# Patient Record
Sex: Female | Born: 2005 | Race: White | Hispanic: No | Marital: Single | Smoking: Current every day smoker
Health system: Southern US, Community
[De-identification: ages and names within clinical notes are randomized; demographics above are authoritative.]

---
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
## 2023-01-24 IMAGING — DX DG CHEST 2V
2 series · 2 of 2 positions shown · non-contrast
Comparison: None.

CLINICAL DATA: Concern for pill stuck in throat.

EXAM:
CHEST - 2 VIEW; NECK SOFT TISSUES - 1+ VIEW

[chest pa]
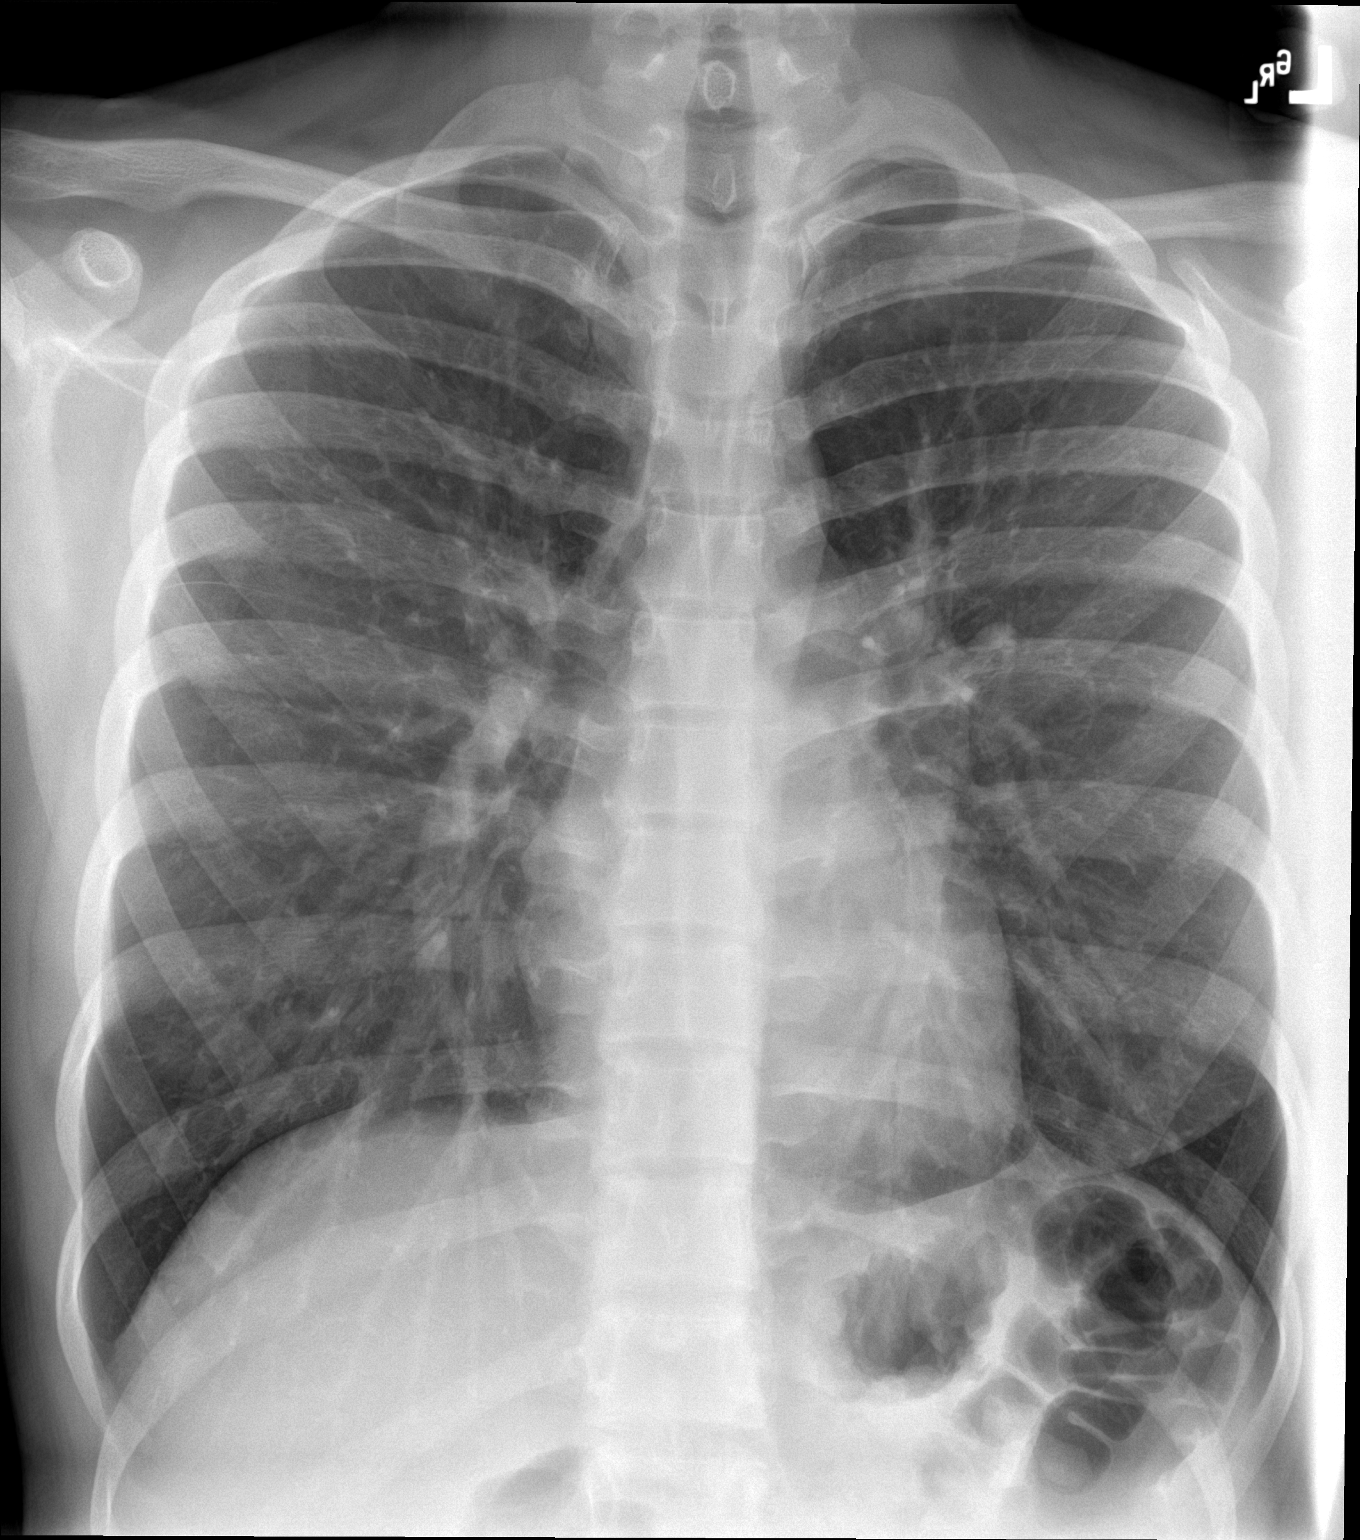

[chest lat]
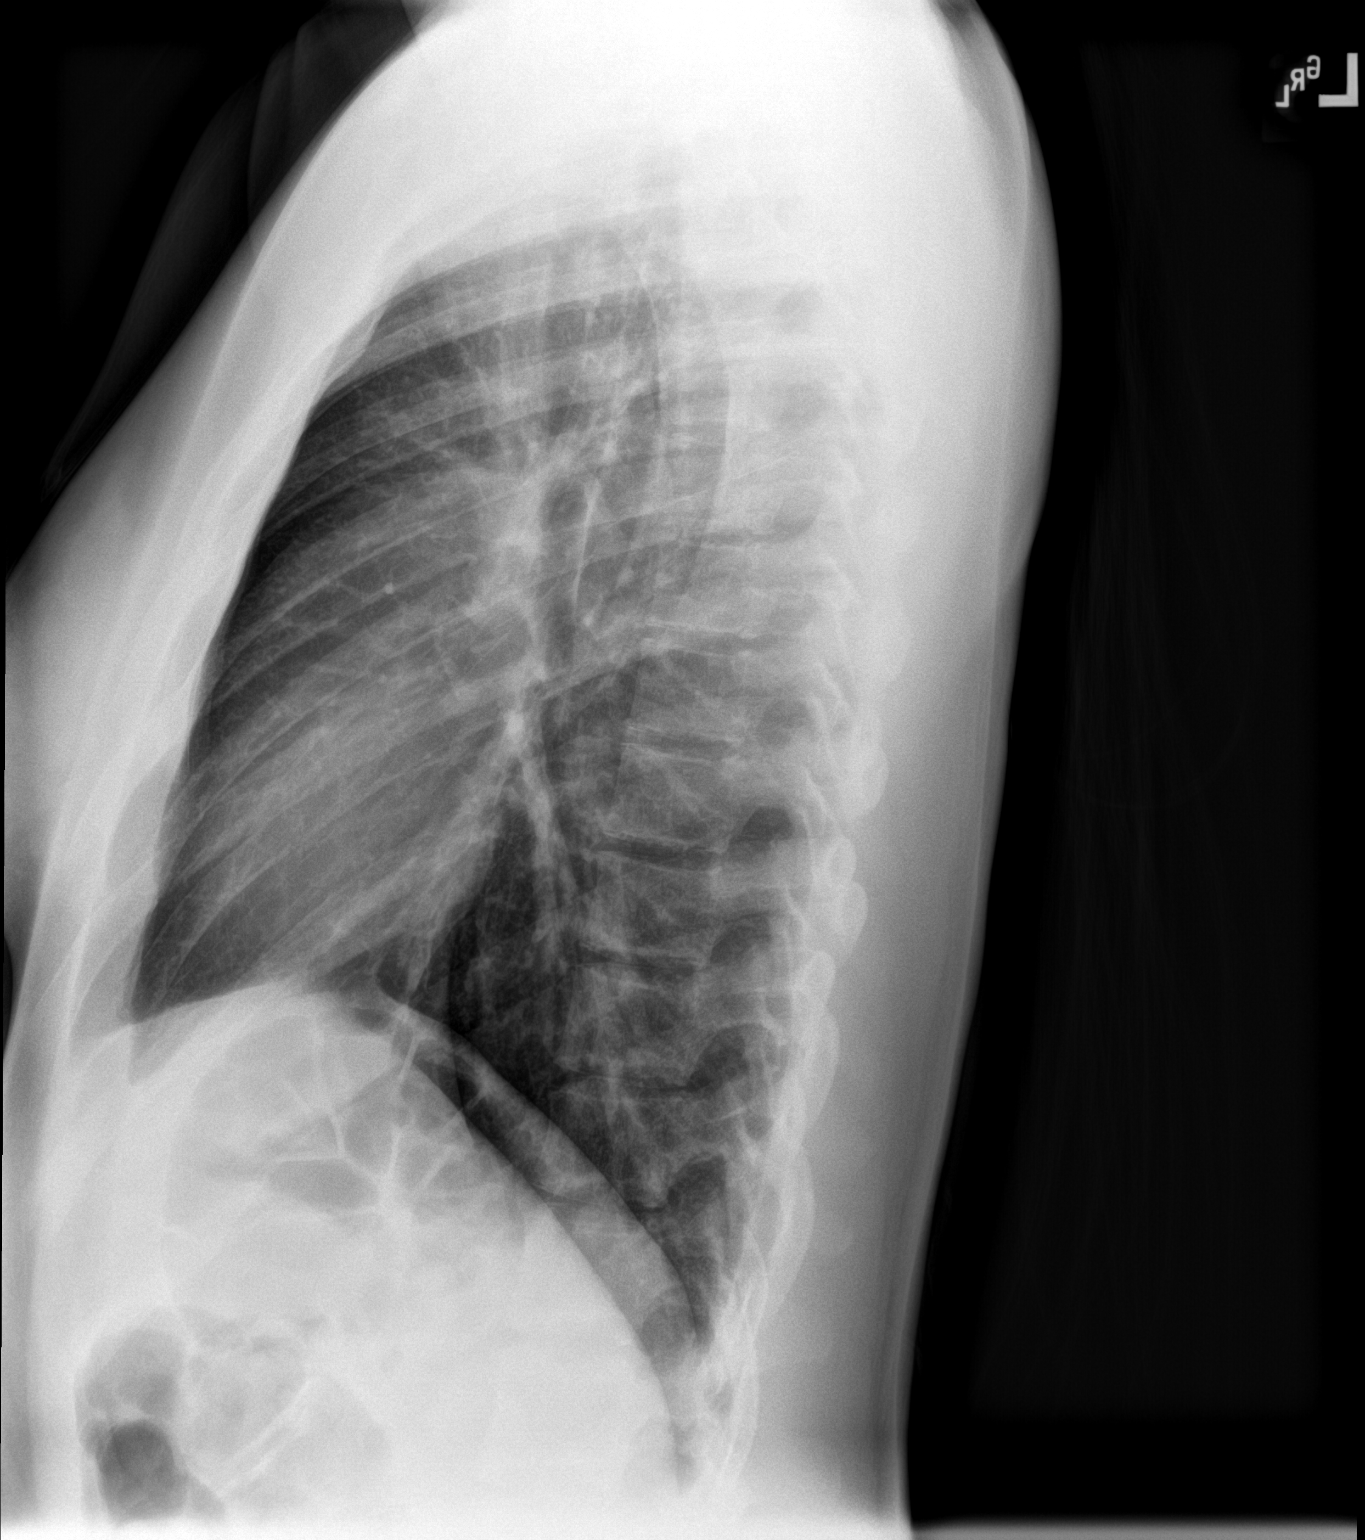

[2 of 2 positions shown; findings below may reference images not displayed]

FINDINGS: There is no evidence of retropharyngeal soft tissue swelling or
epiglottic enlargement. The cervical airway is unremarkable. No
radio-opaque foreign body identified.

No visible radiopaque foreign body projecting over the chest. No
consolidation, features of edema, pneumothorax, or effusion. No
abnormal expansion or asymmetric hyperinflation to suggest airway
obstruction. Pulmonary vascularity is normally distributed. The
cardiomediastinal contours are unremarkable. No acute osseous or
soft tissue abnormality.
IMPRESSION: No visible radiopaque foreign body or discernible pill fragments are
seen within the cervical airway or overlying the airways of the
chest.

No acute cardiopulmonary abnormality. Specifically, no evidence of
airway obstruction.

## 2023-02-27 DIAGNOSIS — Z3042 Encounter for surveillance of injectable contraceptive: Secondary | ICD-10-CM | POA: Diagnosis not present

## 2023-03-10 ENCOUNTER — Ambulatory Visit: Payer: BC Managed Care – PPO | Admitting: Psychiatry

## 2023-04-28 ENCOUNTER — Ambulatory Visit (INDEPENDENT_AMBULATORY_CARE_PROVIDER_SITE_OTHER): Payer: BC Managed Care – PPO | Admitting: Psychiatry

## 2023-04-28 ENCOUNTER — Encounter: Payer: Self-pay | Admitting: Psychiatry

## 2023-04-28 DIAGNOSIS — F4010 Social phobia, unspecified: Secondary | ICD-10-CM | POA: Diagnosis not present

## 2023-04-28 DIAGNOSIS — F3342 Major depressive disorder, recurrent, in full remission: Secondary | ICD-10-CM

## 2023-04-28 DIAGNOSIS — F439 Reaction to severe stress, unspecified: Secondary | ICD-10-CM | POA: Diagnosis not present

## 2023-04-28 MED ORDER — SERTRALINE HCL 100 MG PO TABS: ORAL_TABLET | ORAL | 2 refills | Status: DC

## 2023-04-28 NOTE — Progress Notes (Signed)
[facility] [address], [place]   Follow-up visit  Date of Service: 04/28/2023  CC/Purpose: Routine medication management follow up.    Carol Berg is a 17 y.o. female with a past psychiatric history of depression, anxiety, trauma, substance use who presents today for a psychiatric follow up appointment. Patient is in the custody of her parent.    The patient was last seen on 12/10/22, at which time the following plan was established: Medication management:             - Zoloft 150mg  daily for anxiety  _______________________________________________________________________________________ Acute events/encounters since last visit: none    Carol Berg reports that things have been going pretty well since her last visit. She is in college classes now and still working. She feels this is manageable. She feels that her mood and anxiety are both doing well and has no concerns or questions at this time. No complaints at this time. No SI/Hi/AVH.  Sleep: stable Appetite: Stable Depression: denies Bipolar symptoms:  denies Current suicidal/homicidal ideations:  denied Current auditory/visual hallucinations:  denied     Suicide Attempt/Self-Harm History: denies  Psychotherapy: not currently  Previous psychiatric medication trials:  Abilify, Lexapro, Prozac, Seroquel, Lamictal, clonidine. Unclear benefit in all medicine      School Name: [employer] freshman. 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   There were no vitals taken for this visit.There is no height or weight on file to calculate BMI.  General Appearance: Neat and Well Groomed  Eye Contact:  Good  Speech:  Clear and Coherent and Normal Rate  Mood:   happy  Affect:  Appropriate  Thought Process:  Coherent and Goal Directed  Orientation:  Full (Time, Place, and Person)  Thought Content:  Logical  Suicidal Thoughts:  No  Homicidal Thoughts:  No  Memory:  Immediate;   Good  Judgement:  Fair  Insight:  Fair  Psychomotor Activity:  Normal  Concentration:  Concentration: Good  Recall:  Good  Fund of Knowledge:  Good  Language:  Good  Assets:  Communication Skills Desire for Improvement Financial Resources/Insurance Housing Leisure Time Physical Health Resilience Social Support Talents/Skills Transportation Vocational/Educational  Cognition:  WNL      Assessment   Psychiatric Diagnoses:   ICD-10-CM   1. Social anxiety disorder  F40.10     2. Trauma and stressor-related disorder  F43.9     3. MDD (major depressive disorder), recurrent, in full remission (HCC)  F33.42       Complexity: Moderate  Patient Education and Counseling:  Supportive therapy provided for identified psychosocial stressors.  Medication education provided and decisions regarding medication regimen discussed with patient/guardian.   On assessment today, Carol Berg has remained stable with her medicine. No current issues reported. We will not adjust her medicines at this time given her stability. No SI/HI/AVH.   Plan  Medication management:  - Zoloft 150mg  daily for anxiety  Labs/Studies:  - none  Additional recommendations:  - Recommend starting therapy, Crisis plan reviewed and patient verbally contracts for safety. Go to ED with emergent symptoms or safety concerns, and Risks, benefits, side effects of medications, including any / all black box  warnings, discussed with patient, who verbalizes their understanding    - Recommend cutting down on THC and nicotine use   Follow Up: Return in 3 months - Call in the interim for any side-effects, decompensation, questions, or problems between now and the next visit.   I have spent 25 minutes reviewing the patients chart, meeting with the patient and family, and reviewing medicines and side effects.   [name], MD [facility]

## 2023-07-29 ENCOUNTER — Encounter: Payer: Self-pay | Admitting: Psychiatry

## 2023-07-29 ENCOUNTER — Ambulatory Visit: Payer: BC Managed Care – PPO | Admitting: Psychiatry

## 2023-07-29 DIAGNOSIS — F439 Reaction to severe stress, unspecified: Secondary | ICD-10-CM

## 2023-07-29 DIAGNOSIS — F3342 Major depressive disorder, recurrent, in full remission: Secondary | ICD-10-CM | POA: Diagnosis not present

## 2023-07-29 DIAGNOSIS — F4010 Social phobia, unspecified: Secondary | ICD-10-CM | POA: Diagnosis not present
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
## 2023-09-05 DIAGNOSIS — Z3042 Encounter for surveillance of injectable contraceptive: Secondary | ICD-10-CM | POA: Diagnosis not present

## 2023-09-05 DIAGNOSIS — J029 Acute pharyngitis, unspecified: Secondary | ICD-10-CM | POA: Diagnosis not present

## 2023-10-09 DIAGNOSIS — Z113 Encounter for screening for infections with a predominantly sexual mode of transmission: Secondary | ICD-10-CM | POA: Diagnosis not present

## 2023-10-09 DIAGNOSIS — Z01419 Encounter for gynecological examination (general) (routine) without abnormal findings: Secondary | ICD-10-CM | POA: Diagnosis not present

## 2023-10-09 DIAGNOSIS — Z681 Body mass index (BMI) 19 or less, adult: Secondary | ICD-10-CM | POA: Diagnosis not present

## 2023-12-26 DIAGNOSIS — B349 Viral infection, unspecified: Secondary | ICD-10-CM | POA: Diagnosis not present

## 2023-12-26 DIAGNOSIS — J029 Acute pharyngitis, unspecified: Secondary | ICD-10-CM | POA: Diagnosis not present

## 2024-02-19 ENCOUNTER — Other Ambulatory Visit: Payer: Self-pay | Admitting: Psychiatry

## 2024-02-20 NOTE — Telephone Encounter (Signed)
Please call to schedule FU, was due in June.

## 2024-02-23 NOTE — Telephone Encounter (Signed)
 LVM to schedule follow up. Past due

## 2024-03-16 ENCOUNTER — Other Ambulatory Visit: Payer: Self-pay | Admitting: Psychiatry

## 2024-05-04 DIAGNOSIS — Z23 Encounter for immunization: Secondary | ICD-10-CM | POA: Diagnosis not present

## 2024-05-10 ENCOUNTER — Ambulatory Visit: Admitting: Psychiatry

## 2024-05-14 ENCOUNTER — Ambulatory Visit: Admitting: Psychiatry

## 2024-06-29 DIAGNOSIS — B360 Pityriasis versicolor: Secondary | ICD-10-CM | POA: Diagnosis not present

## 2024-07-12 ENCOUNTER — Other Ambulatory Visit: Payer: Self-pay | Admitting: Psychiatry

## 2024-07-12 NOTE — Telephone Encounter (Signed)
 Please call the patient to schedule her next appointment, as she is overdue and was expected to return in June

## 2024-07-13 DIAGNOSIS — Z0000 Encounter for general adult medical examination without abnormal findings: Secondary | ICD-10-CM | POA: Diagnosis not present

## 2024-07-13 DIAGNOSIS — Z23 Encounter for immunization: Secondary | ICD-10-CM | POA: Diagnosis not present

## 2024-07-13 DIAGNOSIS — Z6820 Body mass index (BMI) 20.0-20.9, adult: Secondary | ICD-10-CM | POA: Diagnosis not present

## 2024-07-13 NOTE — Telephone Encounter (Signed)
 Lvm for pt to call and schedule

## 2024-09-08 ENCOUNTER — Ambulatory Visit: Admitting: Psychiatry
